# Patient Record
Sex: Male | Born: 1985 | Race: White | Hispanic: No | Marital: Single | Smoking: Former smoker
Health system: Southern US, Community
[De-identification: ages and names within clinical notes are randomized; demographics above are authoritative.]

---
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
## 2007-01-20 ENCOUNTER — Encounter (INDEPENDENT_AMBULATORY_CARE_PROVIDER_SITE_OTHER): Payer: Self-pay | Admitting: *Deleted

## 2007-01-20 ENCOUNTER — Ambulatory Visit: Payer: Self-pay | Admitting: Family Medicine

## 2007-01-20 ENCOUNTER — Encounter: Admission: RE | Admit: 2007-01-20 | Discharge: 2007-01-20 | Payer: Self-pay | Admitting: Family Medicine

## 2007-01-20 DIAGNOSIS — M79609 Pain in unspecified limb: Secondary | ICD-10-CM

## 2008-10-14 IMAGING — CR DG TIBIA/FIBULA 2V*L*
4 series · 4 of 4 positions shown · non-contrast
Comparison: None.

LEFT TIBIA AND FIBULA - 2  VIEW:

CLINICAL DATA: Left leg pain. Question stress fracture.

[view not recorded (1 of 4)]
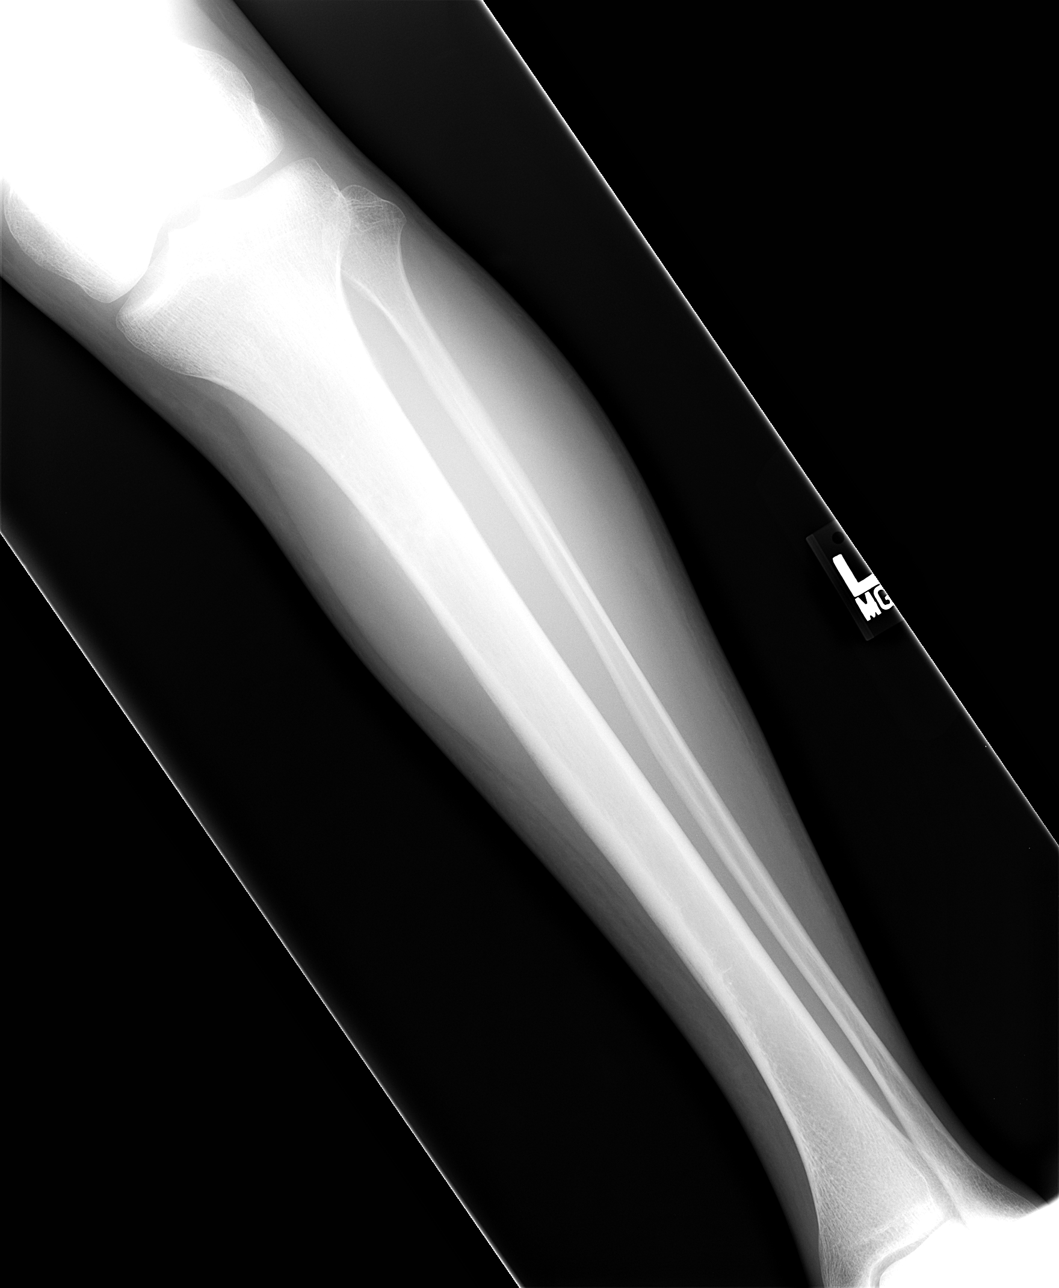

[view not recorded (2 of 4)]
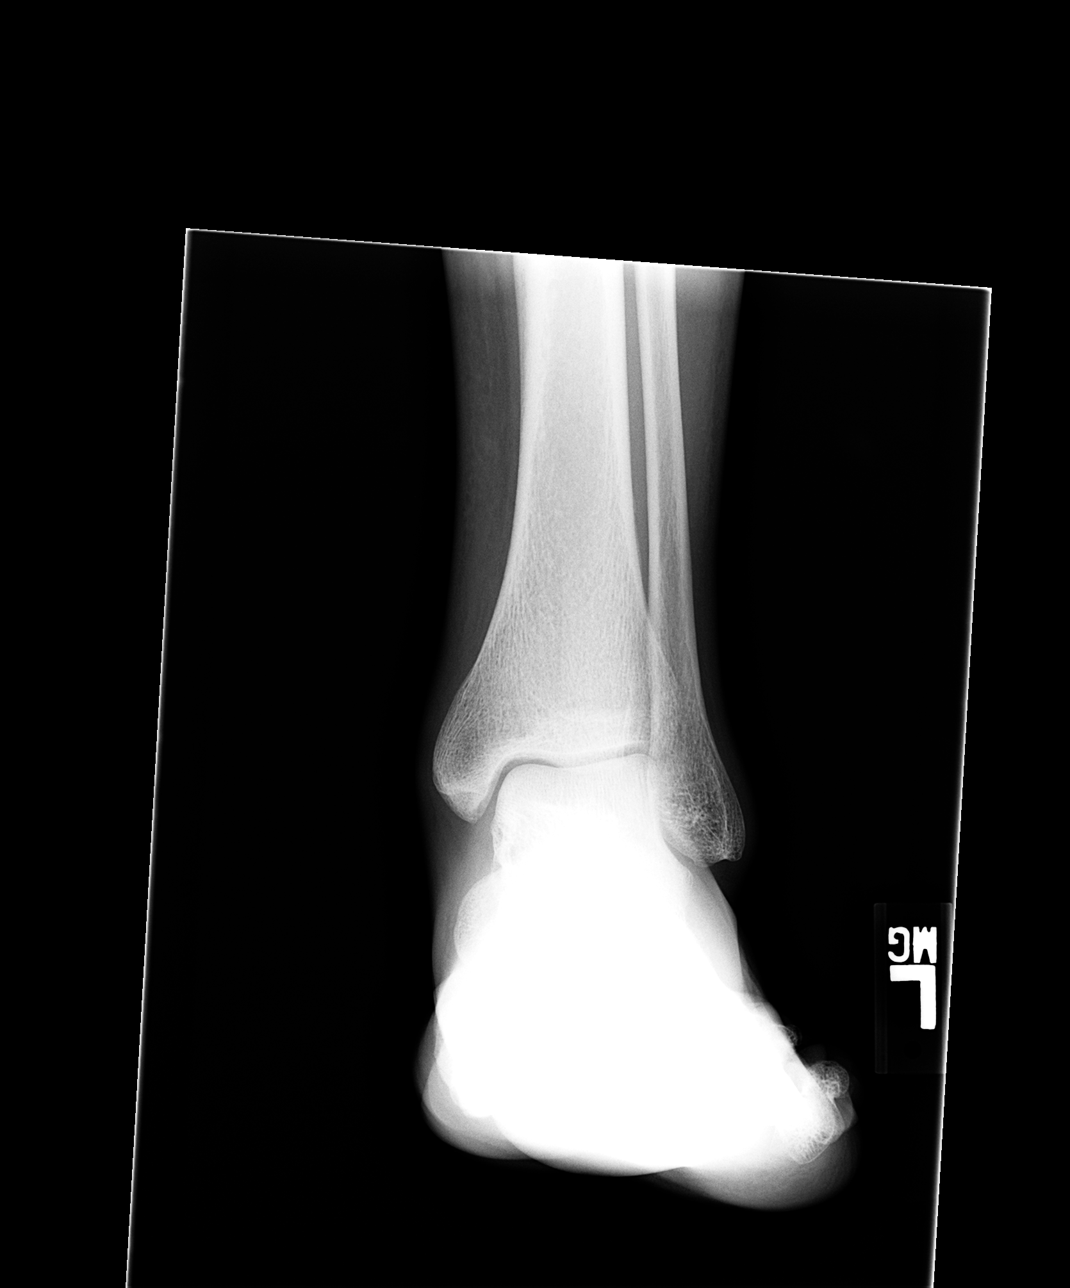

[view not recorded (3 of 4)]
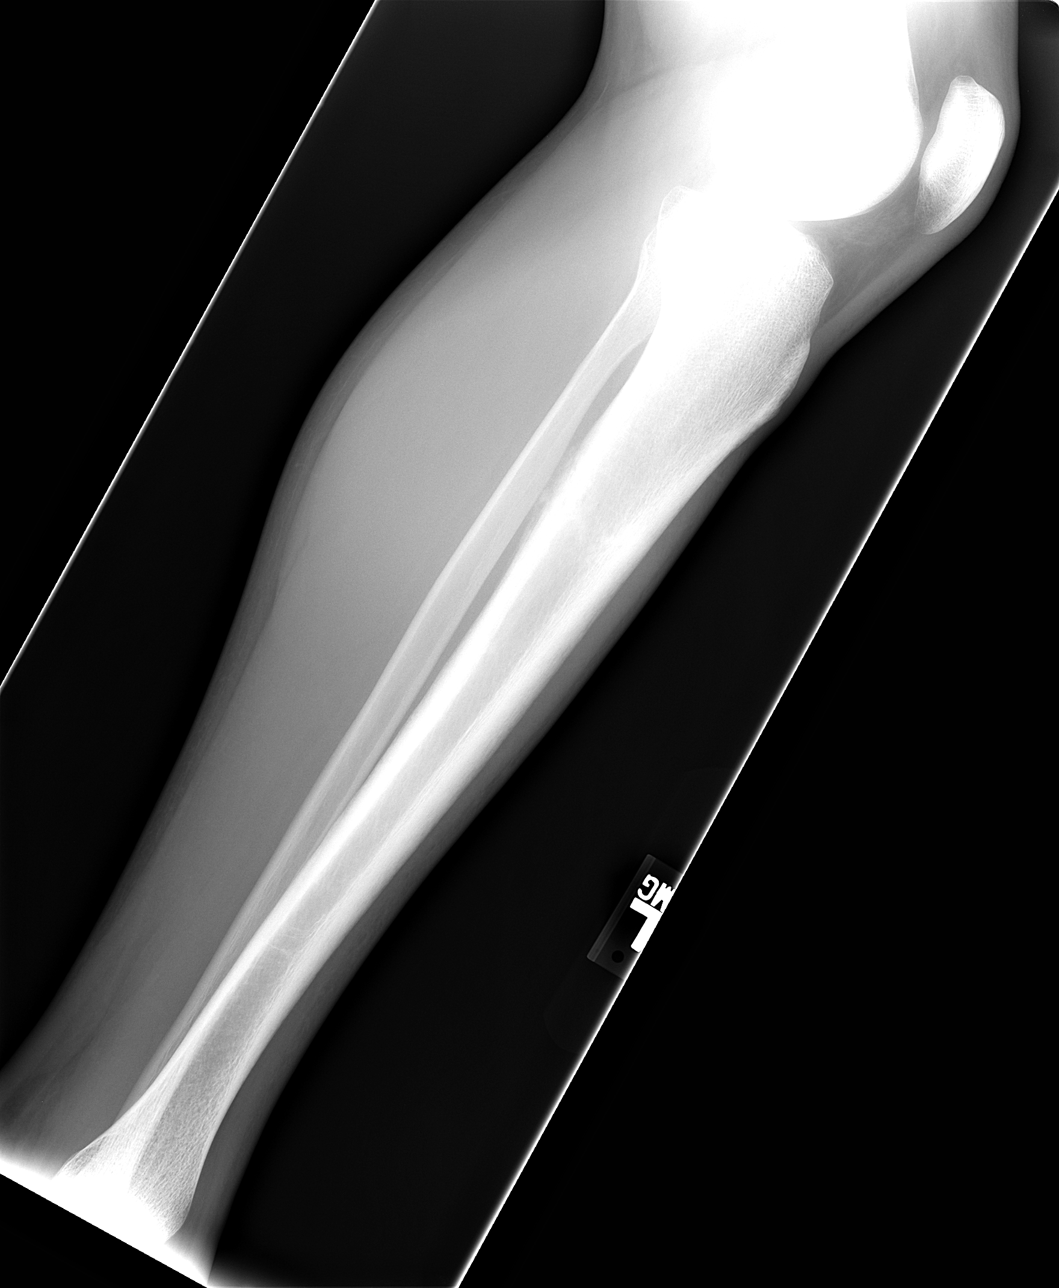

[view not recorded (4 of 4)]
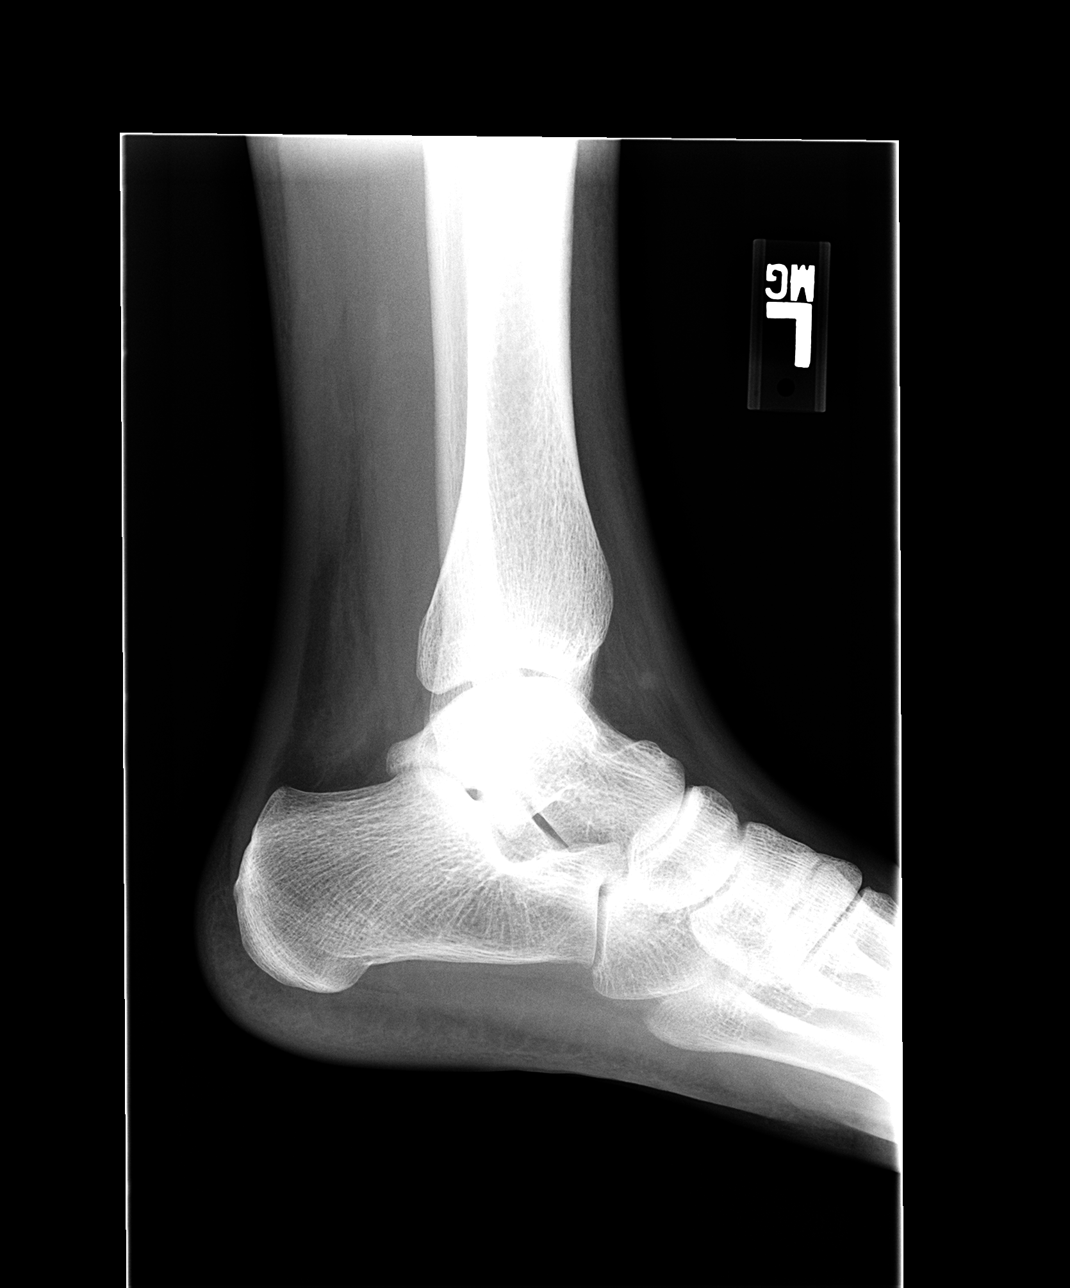

[4 of 4 positions shown; findings below may reference images not displayed]

FINDINGS: Two exam shows a focus of abnormal trabeculation in the proximal
tibial diaphysis. This has an asymmetric somewhat ill-defined lucency in the
medial aspect of the tibia. Although the lateral film suggests a somewhat linear
configuration, the posterior cortex appears to be disrupted and irregular. The
frontal film suggests parosteal reaction/elevation medially.
IMPRESSION: Ill-defined area of abnormal mineralization the proximal tibia with cortical
disruption and a question of periosteal reaction / elevation. These changes may
be related to a stress reaction or stress fracture, but given the somewhat
atypical appearance, MRI evaluation of the tibia is recommended to further
evaluate.

I called these results directly to Dr. Umu at the time of study interpretation.

## 2010-07-05 ENCOUNTER — Encounter: Payer: Self-pay | Admitting: Sports Medicine

## 2012-03-19 ENCOUNTER — Emergency Department (HOSPITAL_COMMUNITY): Admission: EM | Admit: 2012-03-19 | Discharge: 2012-03-19 | Disposition: A | Discharge status: Home / Self Care

## 2012-03-19 NOTE — ED Notes (Signed)
Pt did not answer x 3 

## 2018-04-27 ENCOUNTER — Ambulatory Visit (INDEPENDENT_AMBULATORY_CARE_PROVIDER_SITE_OTHER)

## 2018-04-27 ENCOUNTER — Ambulatory Visit (INDEPENDENT_AMBULATORY_CARE_PROVIDER_SITE_OTHER): Admitting: Orthopaedic Surgery

## 2018-04-27 ENCOUNTER — Encounter (INDEPENDENT_AMBULATORY_CARE_PROVIDER_SITE_OTHER): Payer: Self-pay | Admitting: Orthopaedic Surgery

## 2018-04-27 DIAGNOSIS — M79642 Pain in left hand: Secondary | ICD-10-CM

## 2018-04-27 NOTE — Progress Notes (Signed)
Office Visit Note   Patient: Tyler Page           Date of Birth: [birth year]           MRN: [id number] Visit Date: 04/27/2018              Requested by: No referring provider defined for this encounter. PCP: Patient, No Pcp Per   Assessment & Plan: Visit Diagnoses:  1. Pain in left hand     Plan: Impression is left index finger FDP rupture.  Urgent referral to Dr. Betha LoaKevin Kuzma for surgical evaluation.  Follow-Up Instructions: Return if symptoms worsen or fail to improve.   Orders:  Orders Placed This Encounter  Procedures  . XR Forearm Left  . XR Hand Complete Left   No orders of the defined types were placed in this encounter.     Procedures: No procedures performed   Clinical Data: No additional findings.   Subjective: Chief Complaint  Patient presents with  . Left Wrist - Injury    Felt a pop in volar aspect of wrist while doing dead lifts 7-10 days ago.  Index finger is "still not working well."    Tyler Page is a 32 year old right-hand-dominant gentleman who works in [employer] who comes in with an acute injury to his left index finger while weightlifting.  He was doing dead lifts and he felt a pop in his wrist in which he experienced immediate pain and mild swelling.  He now is unable to flex his index DIP joint.  He denies any significant pain.  No numbness or tingling.   Review of Systems  Constitutional: Negative.   All other systems reviewed and are negative.    Objective: Vital Signs: There were no vitals taken for this visit.  Physical Exam  Constitutional: He is oriented to person, place, and time. He appears well-developed and well-nourished.  HENT:  Head: Normocephalic and atraumatic.  Eyes: Pupils are equal, round, and reactive to light.  Neck: Neck supple.  Pulmonary/Chest: Effort normal.  Abdominal: Soft.  Musculoskeletal: Normal range of motion.  Neurological: He is alert and oriented to person, place, and time.  Skin: Skin is warm.    Psychiatric: He has a normal mood and affect. His behavior is normal. Judgment and thought content normal.  Nursing note and vitals reviewed.   Ortho Exam Left hand exam shows no tenderness along the index finger.  He has no tenderness along the FCR tendon.   He is unable to flex his left index DIP joint.  FDP and FDP S tendons to all the fingers are normal. Specialty Comments:  No specialty comments available.  Imaging: Xr Forearm Left  Result Date: 04/27/2018 No acute or structural abnormalities.  Xr Hand Complete Left  Result Date: 04/27/2018 No acute or structural abnormalities    PMFS History: Patient Active Problem List   Diagnosis Date Noted  . LEG PAIN, LEFT 01/20/2007   No past medical history on file.  No family history on file.   Social History   Occupational History  . Not on file  Tobacco Use  . Smoking status: Former Smoker    Types: Cigarettes    Start date: 06/28/2003    Last attempt to quit: 06/27/2013    Years since quitting: 4.8  . Smokeless tobacco: Never Used  . Tobacco comment: smoked approx 1 pack per week  Substance and Sexual Activity  . Alcohol use: Yes    Comment: socially  . Drug use: Never  .  Sexual activity: Not on file

## 2018-04-27 NOTE — Addendum Note (Signed)
Addended by: Albertina ParrGARCIA, Meeka Cartelli on: 04/27/2018 11:48 AM   Modules accepted: Orders
